# Patient Record
Sex: Male | Born: 1985 | Race: Black or African American | Hispanic: No | Marital: Single | Smoking: Current every day smoker
Health system: Southern US, Community
[De-identification: ages and names within clinical notes are randomized; demographics above are authoritative.]

---
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
## 2005-04-16 ENCOUNTER — Emergency Department (HOSPITAL_COMMUNITY): Admission: EM | Admit: 2005-04-16 | Discharge: 2005-04-16 | Payer: Self-pay | Admitting: Family Medicine

## 2005-09-28 ENCOUNTER — Emergency Department (HOSPITAL_COMMUNITY): Admission: EM | Admit: 2005-09-28 | Discharge: 2005-09-28 | Payer: Self-pay | Admitting: Family Medicine

## 2006-11-25 ENCOUNTER — Emergency Department (HOSPITAL_COMMUNITY): Admission: EM | Admit: 2006-11-25 | Discharge: 2006-11-25 | Payer: Self-pay | Admitting: Emergency Medicine

## 2007-07-12 ENCOUNTER — Emergency Department (HOSPITAL_COMMUNITY): Admission: EM | Admit: 2007-07-12 | Discharge: 2007-07-12 | Payer: Self-pay | Admitting: Emergency Medicine

## 2009-03-11 ENCOUNTER — Emergency Department (HOSPITAL_COMMUNITY): Admission: EM | Admit: 2009-03-11 | Discharge: 2009-03-11 | Payer: Self-pay | Admitting: Emergency Medicine

## 2010-04-23 ENCOUNTER — Emergency Department (HOSPITAL_COMMUNITY)
Admission: EM | Admit: 2010-04-23 | Discharge: 2010-04-23 | Payer: Self-pay | Source: Home / Self Care | Admitting: Family Medicine

## 2011-08-04 IMAGING — CR DG ANKLE COMPLETE 3+V*R*
3 series · 3 of 3 positions shown · non-contrast
Comparison: Foot film of the same day.

CLINICAL DATA: Right ankle injury.  Ankle pain.  Lateral foot pain.

RIGHT ANKLE - COMPLETE 3+ VIEW

[view not recorded (1 of 3)]
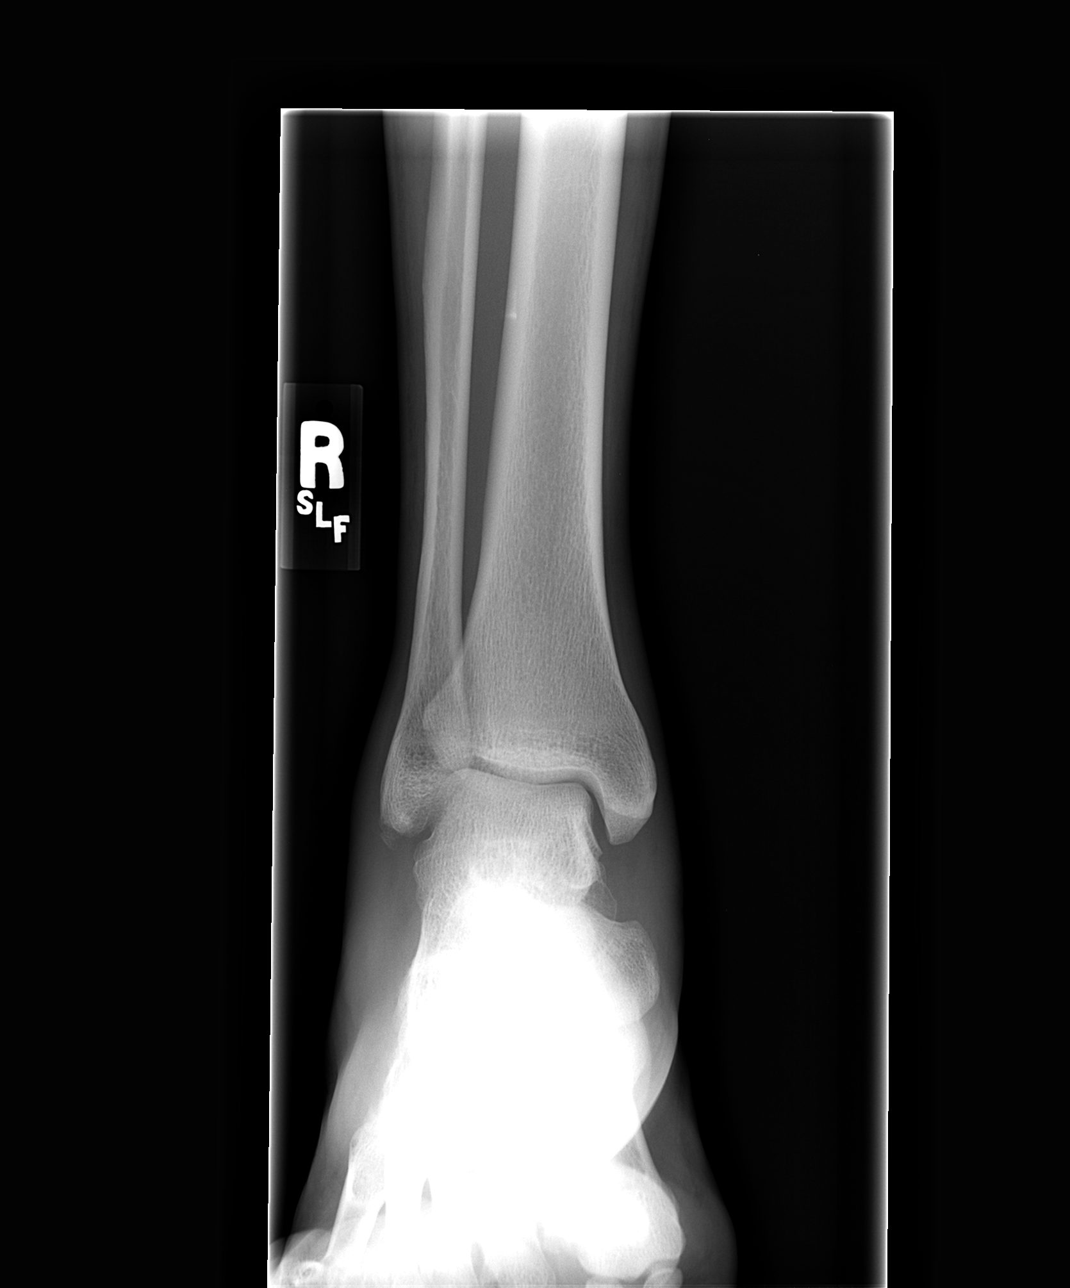

[view not recorded (2 of 3)]
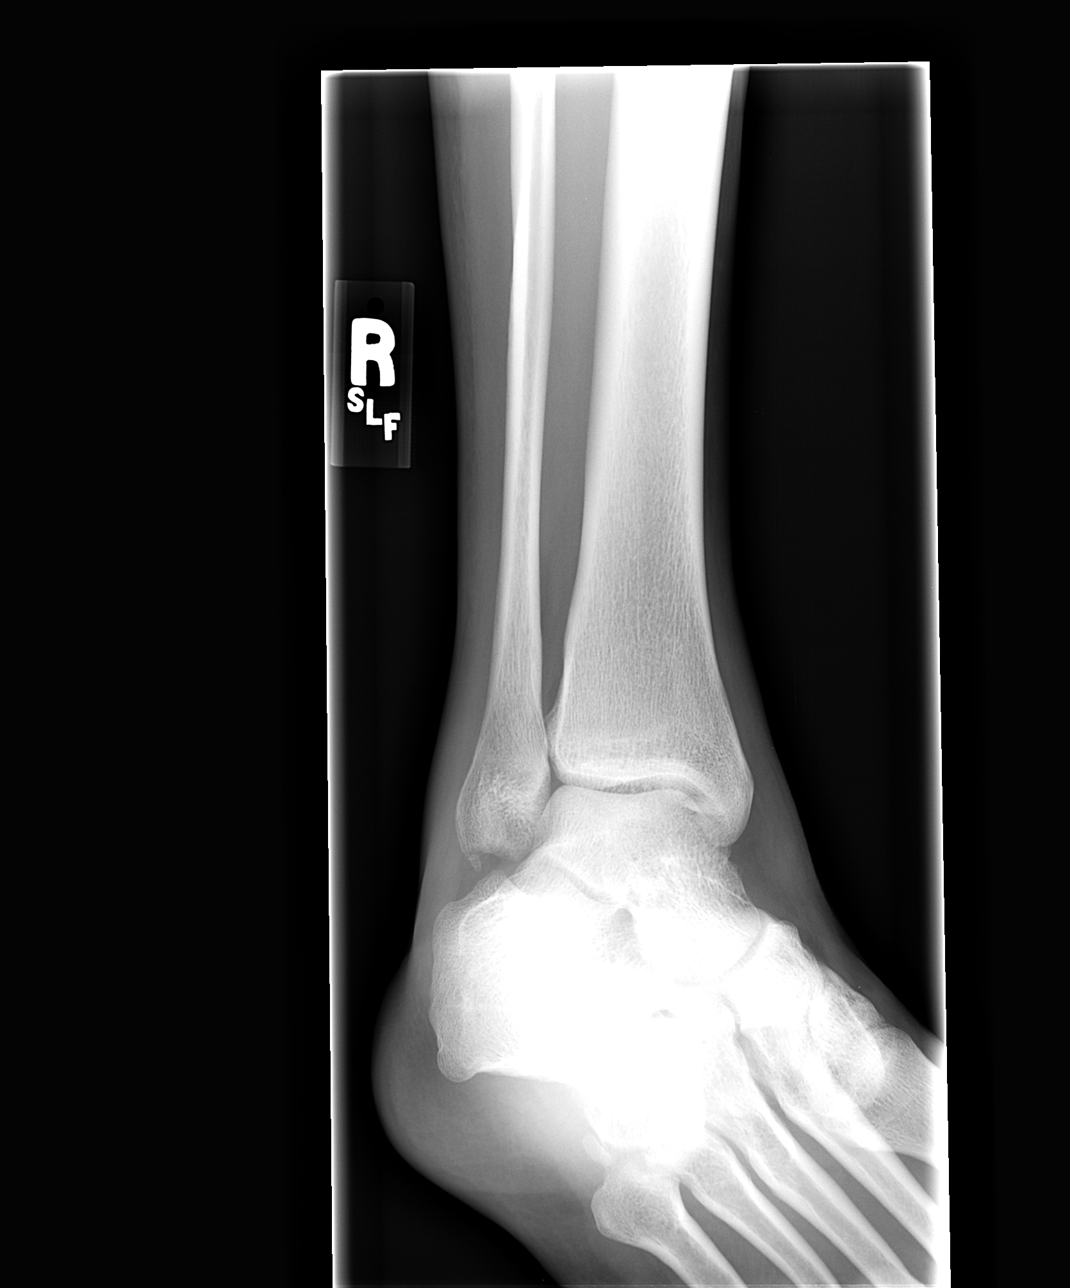

[view not recorded (3 of 3)]
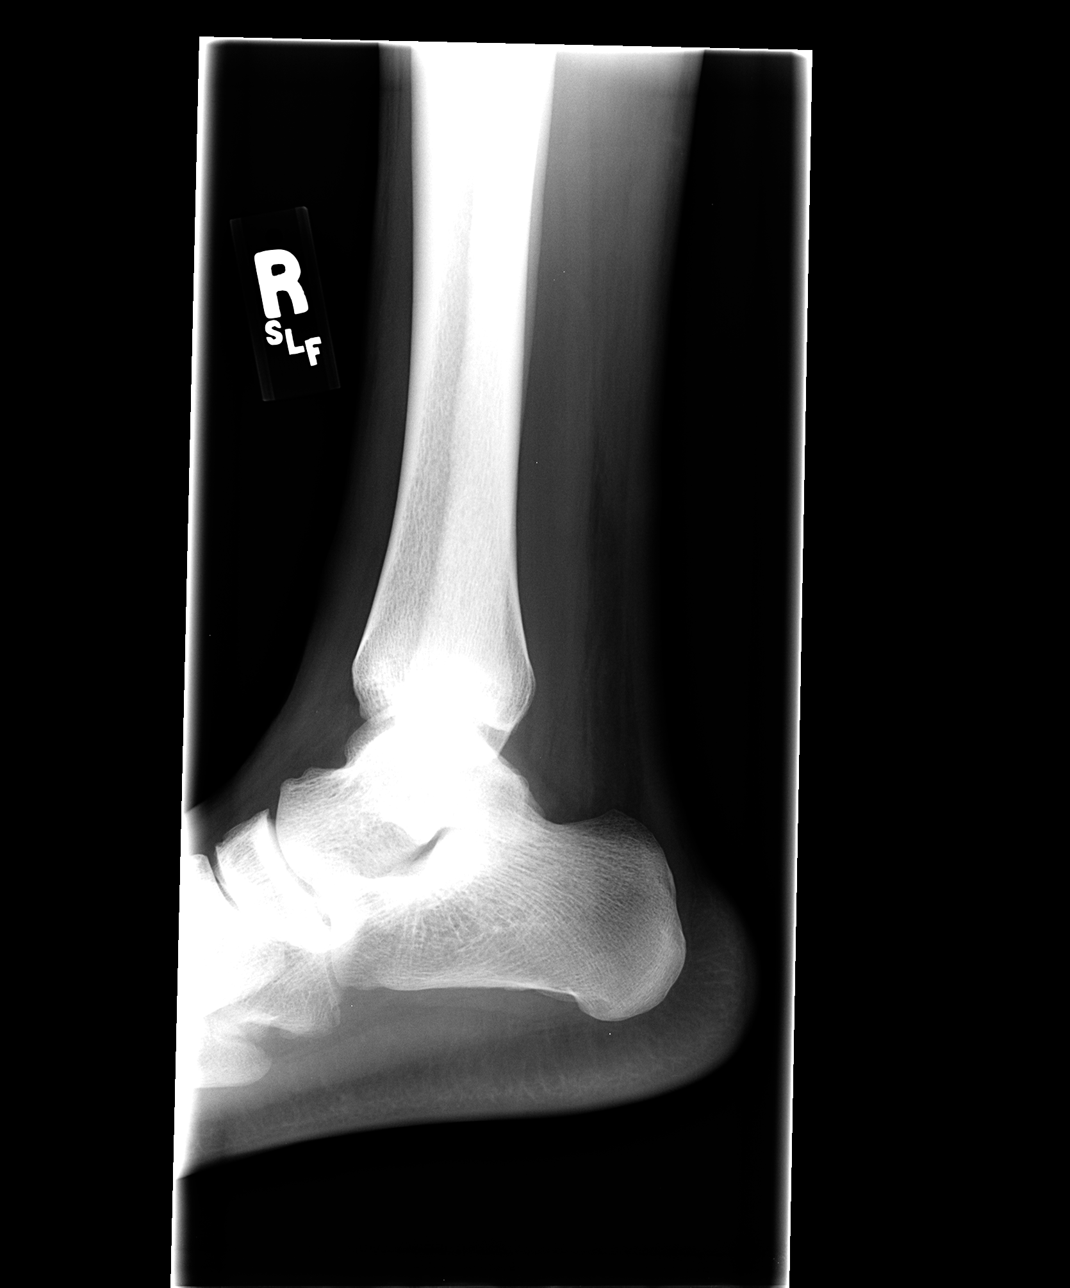

[3 of 3 positions shown; findings below may reference images not displayed]

FINDINGS: Soft tissue swelling is present over the lateral
malleolus.  A small bone fragment may be corticated.  There is no
definite joint effusion.  No additional fractures are evident.
IMPRESSION: 1.  Soft tissue swelling over lateral malleolus.
2.  A small bone fragments at the tip of the fibula may represent a
small avulsion fracture.  This may be corticated and represent a
more remote injury.
3.  Otherwise unremarkable ankle.

## 2014-08-19 ENCOUNTER — Emergency Department (HOSPITAL_COMMUNITY)
Admission: EM | Admit: 2014-08-19 | Discharge: 2014-08-20 | Disposition: A | Discharge status: Home / Self Care | Payer: Self-pay | Attending: Emergency Medicine | Admitting: Emergency Medicine

## 2014-08-19 ENCOUNTER — Emergency Department (HOSPITAL_COMMUNITY): Payer: Self-pay

## 2014-08-19 ENCOUNTER — Encounter (HOSPITAL_COMMUNITY): Payer: Self-pay | Admitting: Emergency Medicine

## 2014-08-19 DIAGNOSIS — S93602A Unspecified sprain of left foot, initial encounter: Secondary | ICD-10-CM | POA: Insufficient documentation

## 2014-08-19 DIAGNOSIS — Y998 Other external cause status: Secondary | ICD-10-CM | POA: Insufficient documentation

## 2014-08-19 DIAGNOSIS — Z720 Tobacco use: Secondary | ICD-10-CM | POA: Insufficient documentation

## 2014-08-19 DIAGNOSIS — Y9361 Activity, american tackle football: Secondary | ICD-10-CM | POA: Insufficient documentation

## 2014-08-19 DIAGNOSIS — W1842XA Slipping, tripping and stumbling without falling due to stepping into hole or opening, initial encounter: Secondary | ICD-10-CM | POA: Insufficient documentation

## 2014-08-19 DIAGNOSIS — Y92321 Football field as the place of occurrence of the external cause: Secondary | ICD-10-CM | POA: Insufficient documentation

## 2014-08-19 MED ORDER — NAPROXEN 500 MG PO TABS: 500.0000 mg | ORAL_TABLET | Freq: Two times a day (BID) | ORAL | Status: AC

## 2014-08-19 MED ORDER — NAPROXEN 250 MG PO TABS
500.0000 mg | ORAL_TABLET | Freq: Once | ORAL | Status: AC
  Administered 2014-08-19: 500 mg via ORAL
  Filled 2014-08-19: qty 2

## 2014-08-19 NOTE — Discharge Instructions (Signed)
Take naproxen as prescribed. Review instructions below. Ice your foot 3-4 times per day and keep it elevated as much as possible.  Joint Sprain A sprain is a tear or stretch in the ligaments that hold a joint together. Severe sprains may need as long as 3-6 weeks of immobilization and/or exercises to heal completely. Sprained joints should be rested and protected. If not, they can become unstable and prone to re-injury. Proper treatment can reduce your pain, shorten the period of disability, and reduce the risk of repeated injuries. TREATMENT   Rest and elevate the injured joint to reduce pain and swelling.  Apply ice packs to the injury for 20-30 minutes every 2-3 hours for the next 2-3 days.  Keep the injury wrapped in a compression bandage or splint as long as the joint is painful or as instructed by your caregiver.  Do not use the injured joint until it is completely healed to prevent re-injury and chronic instability. Follow the instructions of your caregiver.  Long-term sprain management may require exercises and/or treatment by a physical therapist. Taping or special braces may help stabilize the joint until it is completely better. SEEK MEDICAL CARE IF:   You develop increased pain or swelling of the joint.  You develop increasing redness and warmth of the joint.  You develop a fever.  It becomes stiff.  Your hand or foot gets cold or numb. Document Released: 04/30/2004 Document Revised: 06/15/2011 Document Reviewed: 04/09/2008 [facility] Patient Information 2015 [place], [place]. This information is not intended to replace advice given to you by your health care provider. Make sure you discuss any questions you have with your health care provider.  RICE: Routine Care for Injuries The routine care of many injuries includes Rest, Ice, Compression, and Elevation (RICE). HOME CARE INSTRUCTIONS  Rest is needed to allow your body to heal. Routine activities can usually be resumed  when comfortable. Injured tendons and bones can take up to 6 weeks to heal. Tendons are the cord-like structures that attach muscle to bone.  Ice following an injury helps keep the swelling down and reduces pain.  Put ice in a plastic bag.  Place a towel between your skin and the bag.  Leave the ice on for 15-20 minutes, 3-4 times a day, or as directed by your health care provider. Do this while awake, for the first 24 to 48 hours. After that, continue as directed by your caregiver.  Compression helps keep swelling down. It also gives support and helps with discomfort. If an elastic bandage has been applied, it should be removed and reapplied every 3 to 4 hours. It should not be applied tightly, but firmly enough to keep swelling down. Watch fingers or toes for swelling, bluish discoloration, coldness, numbness, or excessive pain. If any of these problems occur, remove the bandage and reapply loosely. Contact your caregiver if these problems continue.  Elevation helps reduce swelling and decreases pain. With extremities, such as the arms, hands, legs, and feet, the injured area should be placed near or above the level of the heart, if possible. SEEK IMMEDIATE MEDICAL CARE IF:  You have persistent pain and swelling.  You develop redness, numbness, or unexpected weakness.  Your symptoms are getting worse rather than improving after several days. These symptoms may indicate that further evaluation or further X-rays are needed. Sometimes, X-rays may not show a small broken bone (fracture) until 1 week or 10 days later. Make a follow-up appointment with your caregiver. Ask when your X-ray  results will be ready. Make sure you get your X-ray results. Document Released: 07/05/2000 Document Revised: 03/28/2013 Document Reviewed: 08/22/2010 [facility] Patient Information 2015 [place], [place]. This information is not intended to replace advice given to you by your health care provider. Make sure you discuss  any questions you have with your health care provider.

## 2014-08-19 NOTE — ED Provider Notes (Signed)
CSN: [id number]     Arrival date & time 08/19/14  2111 History   First MD Initiated Contact with Patient 08/19/14 2135     Chief Complaint  Patient presents with  . Foot Injury    (Consider location/radiation/quality/duration/timing/severity/associated sxs/prior Treatment) HPI Comments: 29 year old male with no significant past medical history presents to the emergency department for further evaluation of left foot pain. Patient has had throbbing pain in his lateral left foot on the dorsal aspect since playing football and stepping in a pothole in the field. Patient reports hearing a "crunch". He states they've tried elevating his foot without relief. He has had associated swelling. Pain is worse when bearing weight. No medications taken prior to arrival for symptoms. Patient denies associated pallor, extremity numbness/paresthesias, and extremity weakness. No history of prior injury to left foot.  Patient is a 29 y.o. male presenting with foot injury. The history is provided by the patient. No language interpreter was used.  Foot Injury   History reviewed. No pertinent past medical history. History reviewed. No pertinent past surgical history. History reviewed. No pertinent family history. History  Substance Use Topics  . Smoking status: Current Every Day Smoker  . Smokeless tobacco: Not on file  . Alcohol Use: Yes    Review of Systems  Musculoskeletal: Positive for joint swelling and arthralgias.  All other systems reviewed and are negative.   Allergies  Review of patient's allergies indicates no known allergies.  Home Medications   Prior to Admission medications   Medication Sig Start Date End Date Taking? Authorizing Provider  naproxen (NAPROSYN) 500 MG tablet Take 1 tablet (500 mg total) by mouth 2 (two) times daily. 08/19/14   Antony MaduraKelly Anthony Tamburo, PA-C   BP 165/97 mmHg  Pulse 57  Temp(Src) 97.9 F (36.6 C) (Oral)  Resp 15  Ht 6' (1.829 m)  Wt 145 lb (65.772 kg)  BMI 19.66  kg/m2  SpO2 100%   Physical Exam  Constitutional: He is oriented to person, place, and time. He appears well-developed and well-nourished. No distress.  HENT:  Head: Normocephalic and atraumatic.  Eyes: Conjunctivae and EOM are normal. No scleral icterus.  Neck: Normal range of motion.  Cardiovascular: Normal rate, regular rhythm and intact distal pulses.   Pulses:      Dorsalis pedis pulses are 2+ on the left side.       Posterior tibial pulses are 2+ on the left side.  Pulmonary/Chest: Effort normal. No respiratory distress.  Musculoskeletal: Normal range of motion.       Left ankle: Normal.       Left foot: There is tenderness, bony tenderness and swelling. There is normal range of motion, normal capillary refill, no crepitus and no deformity.       Feet:  Neurological: He is alert and oriented to person, place, and time. He exhibits normal muscle tone. Coordination normal.  Sensation to light touch intact in LLE  Skin: Skin is warm and dry. No rash noted. He is not diaphoretic. No erythema. No pallor.  Psychiatric: He has a normal mood and affect. His behavior is normal.  Nursing note and vitals reviewed.   ED Course  Procedures (including critical care time) Labs Review Labs Reviewed - No data to display  Imaging Review Dg Foot Complete Left  08/19/2014   CLINICAL DATA:  Left foot pain after football injury. Foot stepped in a hole in the ground.  EXAM: LEFT FOOT - COMPLETE 3+ VIEW  COMPARISON:  None.  FINDINGS: Minimal  cortical irregularity noted about the fourth toe proximal phalanx, question surrounding callus formation. There is otherwise no fracture. The alignment and joint spaces are maintained. No focal soft tissue abnormality.  IMPRESSION: Cortical irregularity at the fourth toe proximal phalanx, concerning for fracture. Acuity is uncertain given the probable surrounding callus formation, and correlation with point tenderness is recommended to evaluate for acute injury.  Remainder of the foot is negative.   Electronically Signed   By: Rubye OaksMelanie  Ehinger M.D.   On: 08/19/2014 22:54     EKG Interpretation None      MDM   Final diagnoses:  Foot sprain, left, initial encounter    29 year old male presents to the emergency department for further evaluation of left foot injury. He is neurovascularly intact. Mild swelling noted. X-ray shows cortical irregularity at the fourth toe of unknown acuity. Patient has no point tenderness to this area. Suspect old deformity. Symptoms at area of pain are likely secondary to muscle sprain. Patient given ASO ankle and crutches for WBAT. Orthopedic referral given and return precautions discussed. Patient agreeable to plan with no unaddressed concerns. Patient discharged in good condition.   Filed Vitals:   08/19/14 2119  BP: 165/97  Pulse: 57  Temp: 97.9 F (36.6 C)  TempSrc: Oral  Resp: 15  Height: 6' (1.829 m)  Weight: 145 lb (65.772 kg)  SpO2: 100%       Antony MaduraKelly Laiklynn Raczynski, PA-C 08/19/14 2323  Benjiman CoreNathan Pickering, MD 08/19/14 2337

## 2014-08-19 NOTE — ED Notes (Signed)
Patient with left foot injury.  Patient was playing football and stepped in a pothole in the field and heard a crunch.  Patient does have some swelling to the foot with the pain.

## 2015-11-30 IMAGING — DX DG FOOT COMPLETE 3+V*L*
3 series · 3 of 3 positions shown · non-contrast
Comparison: None.

CLINICAL DATA: Left foot pain after football injury. Foot stepped
in a hole in the ground.

EXAM:
LEFT FOOT - COMPLETE 3+ VIEW

[foot ap]
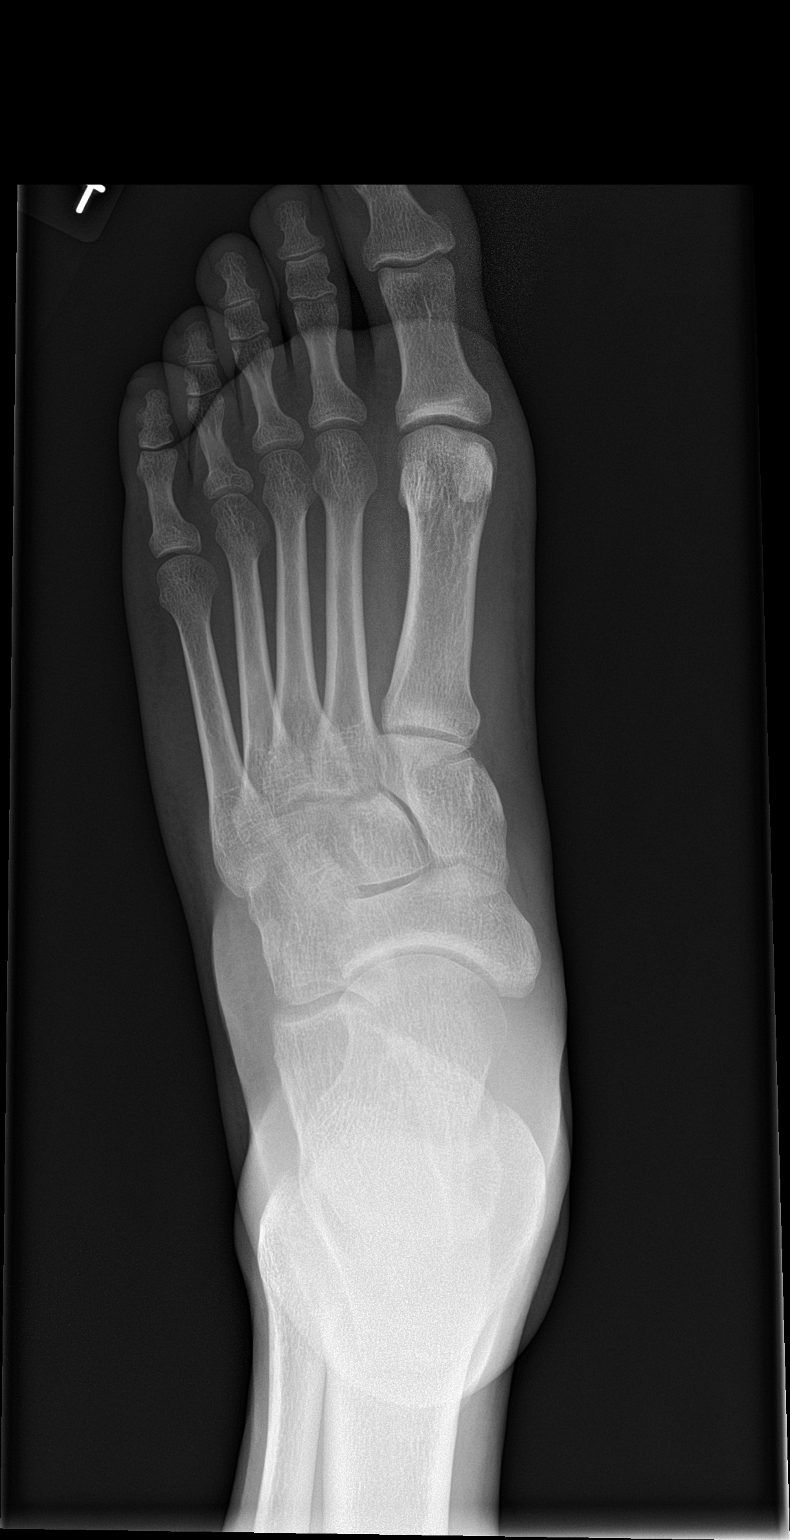

[foot obl]
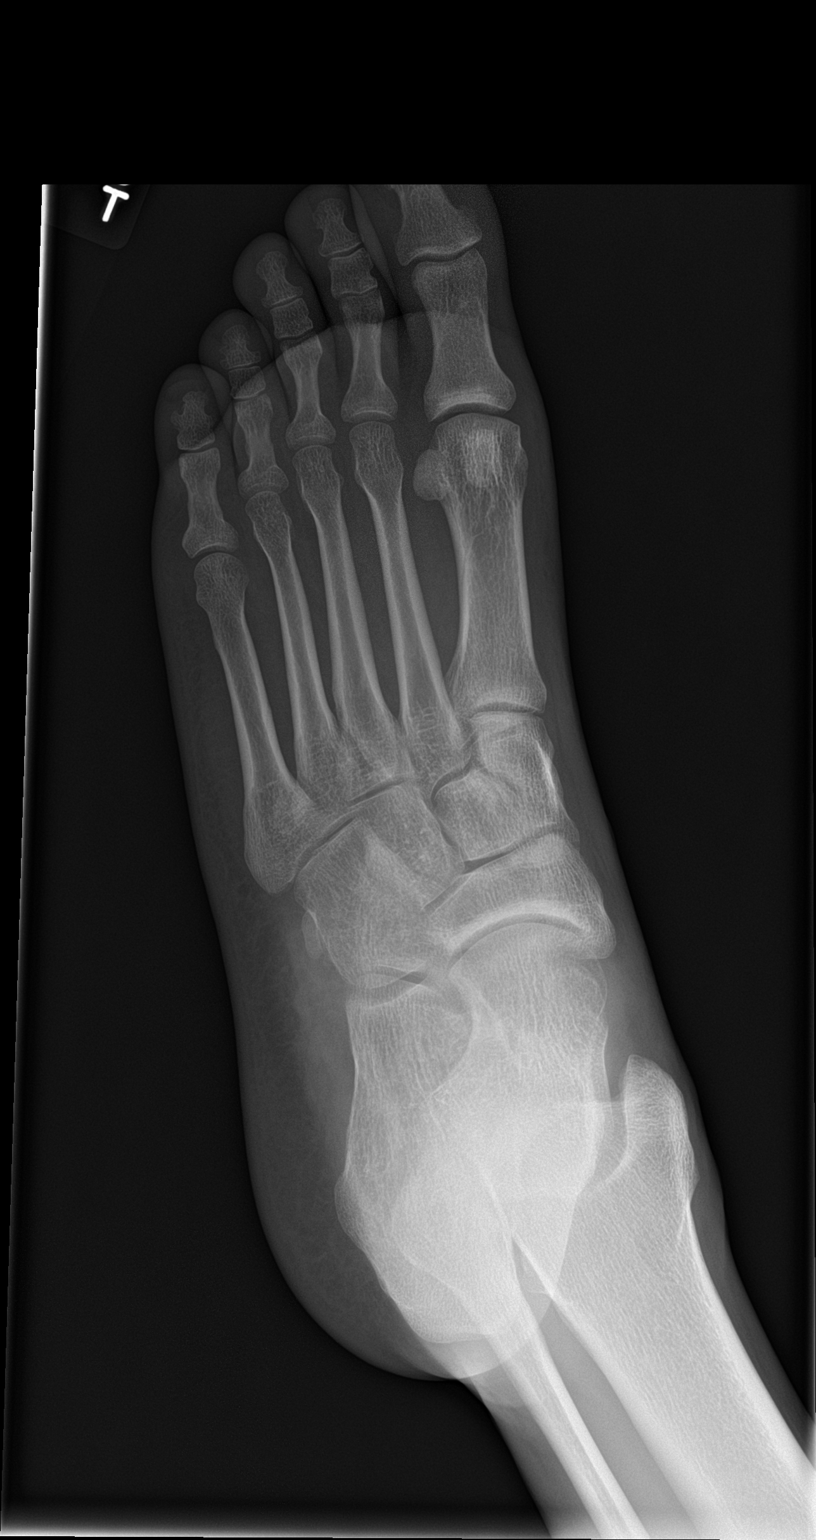

[foot lat]
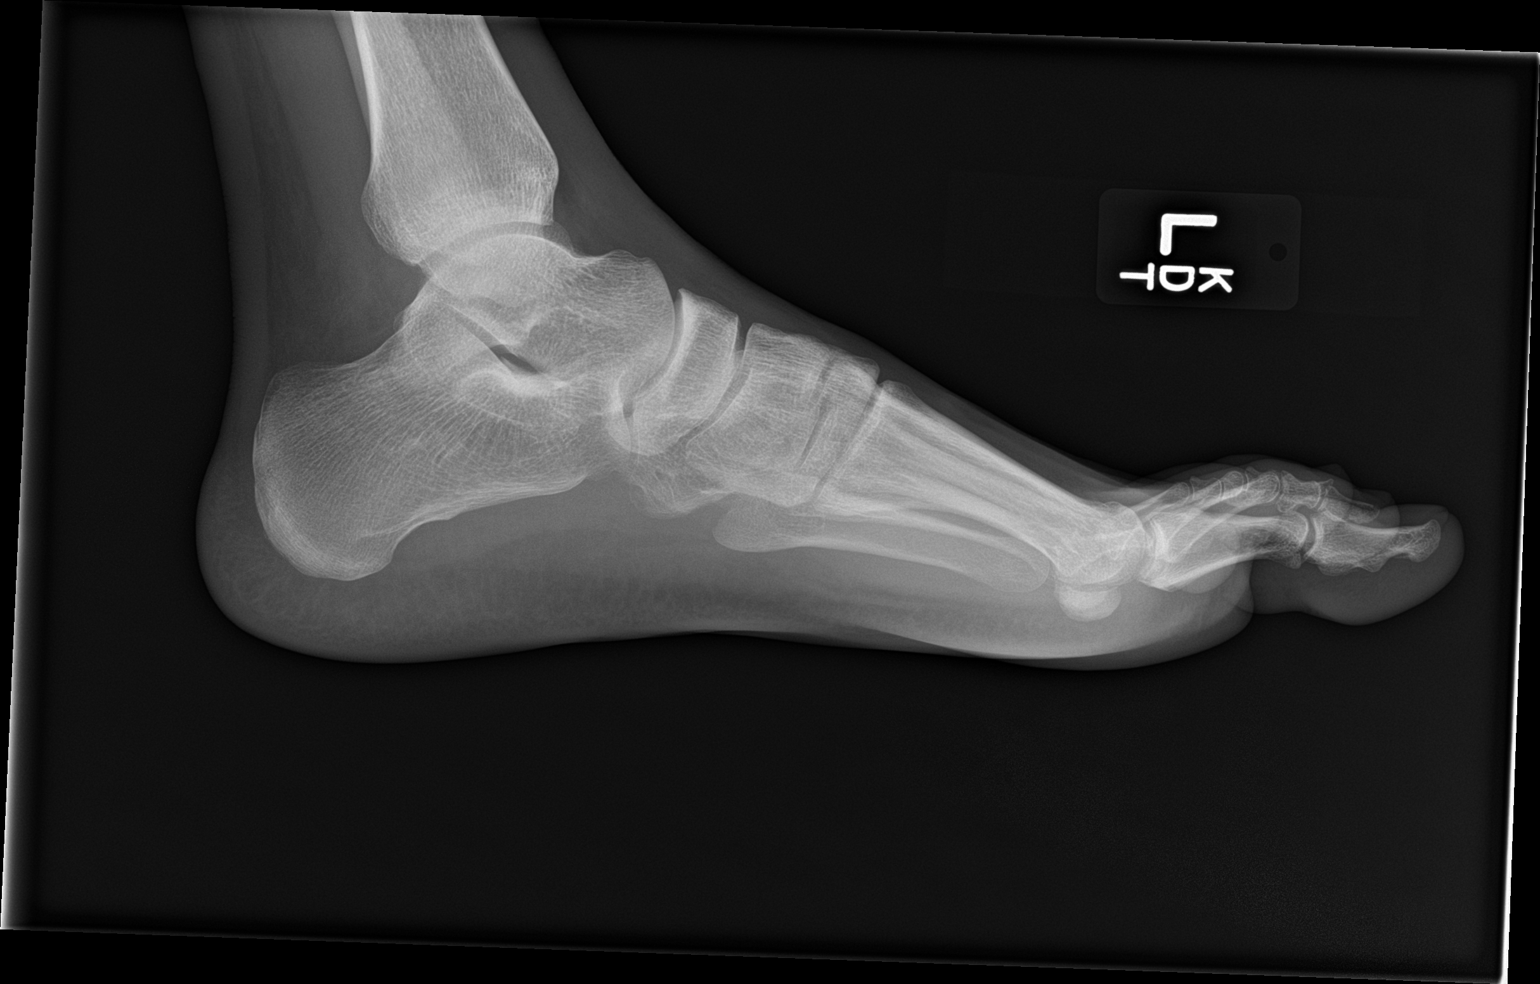

[3 of 3 positions shown; findings below may reference images not displayed]

FINDINGS: Minimal cortical irregularity noted about the fourth toe proximal
phalanx, question surrounding callus formation. There is otherwise
no fracture. The alignment and joint spaces are maintained. No focal
soft tissue abnormality.
IMPRESSION: Cortical irregularity at the fourth toe proximal phalanx, concerning
for fracture. Acuity is uncertain given the probable surrounding
callus formation, and correlation with point tenderness is
recommended to evaluate for acute injury. Remainder of the foot is
negative.
